# Patient Record
Sex: Male | Born: 1960 | Race: Black or African American | Hispanic: No | Marital: Married | Smoking: Current every day smoker
Health system: Southern US, Community
[De-identification: ages and names within clinical notes are randomized; demographics above are authoritative.]

## PROBLEM LIST (undated history)

## (undated) ENCOUNTER — Emergency Department (HOSPITAL_COMMUNITY): Discharge status: Absent without leave | Payer: Self-pay

## (undated) DIAGNOSIS — H409 Unspecified glaucoma: Secondary | ICD-10-CM

## (undated) DIAGNOSIS — I10 Essential (primary) hypertension: Secondary | ICD-10-CM

## (undated) HISTORY — PX: KNEE SURGERY: SHX244

## (undated) HISTORY — DX: Essential (primary) hypertension: I10

## (undated) HISTORY — DX: Unspecified glaucoma: H40.9

---

## 2013-08-20 ENCOUNTER — Emergency Department (HOSPITAL_COMMUNITY)
Admission: EM | Admit: 2013-08-20 | Discharge: 2013-08-20 | Disposition: A | Discharge status: Home / Self Care | Payer: Self-pay | Attending: Emergency Medicine | Admitting: Emergency Medicine

## 2013-08-20 DIAGNOSIS — R748 Abnormal levels of other serum enzymes: Secondary | ICD-10-CM | POA: Insufficient documentation

## 2013-08-20 DIAGNOSIS — K2970 Gastritis, unspecified, without bleeding: Secondary | ICD-10-CM | POA: Insufficient documentation

## 2013-08-20 DIAGNOSIS — R1013 Epigastric pain: Secondary | ICD-10-CM

## 2013-08-20 DIAGNOSIS — F1010 Alcohol abuse, uncomplicated: Secondary | ICD-10-CM | POA: Insufficient documentation

## 2013-08-20 DIAGNOSIS — Z720 Tobacco use: Secondary | ICD-10-CM

## 2013-08-20 DIAGNOSIS — K920 Hematemesis: Secondary | ICD-10-CM | POA: Insufficient documentation

## 2013-08-20 DIAGNOSIS — F17200 Nicotine dependence, unspecified, uncomplicated: Secondary | ICD-10-CM | POA: Insufficient documentation

## 2013-08-20 LAB — COMPREHENSIVE METABOLIC PANEL
Albumin: 3.5 g/dL (ref 3.5–5.2)
BUN: 12 mg/dL (ref 6–23)
CO2: 25 mEq/L (ref 19–32)
Calcium: 9.2 mg/dL (ref 8.4–10.5)
Creatinine, Ser: 0.79 mg/dL (ref 0.50–1.35)
GFR calc Af Amer: >90 mL/min (ref >90)
GFR calc non Af Amer: >90 mL/min (ref >90)
Glucose, Bld: 96 mg/dL (ref 70–99)

## 2013-08-20 LAB — CBC WITH DIFFERENTIAL/PLATELET
Basophils Absolute: 0 10*3/uL (ref 0.0–0.1)
Eosinophils Absolute: 0.4 10*3/uL (ref 0.0–0.7)
Eosinophils Relative: 6 % — ABNORMAL HIGH (ref 0–5)
HCT: 42.2 % (ref 39.0–52.0)
Lymphocytes Relative: 46 % (ref 12–46)
Lymphs Abs: 3.1 10*3/uL (ref 0.7–4.0)
MCV: 80.8 fL (ref 78.0–100.0)
Monocytes Absolute: 0.6 10*3/uL (ref 0.1–1.0)
Monocytes Relative: 9 % (ref 3–12)
RBC: 5.22 MIL/uL (ref 4.22–5.81)
RDW: 13.1 % (ref 11.5–15.5)
WBC: 6.7 10*3/uL (ref 4.0–10.5)

## 2013-08-20 LAB — LIPASE, BLOOD: Lipase: 264 U/L — ABNORMAL HIGH (ref 11–59)

## 2013-08-20 LAB — URINALYSIS, ROUTINE W REFLEX MICROSCOPIC
Hgb urine dipstick: NEGATIVE
Ketones, ur: NEGATIVE mg/dL
Leukocytes, UA: NEGATIVE
Nitrite: NEGATIVE
Protein, ur: NEGATIVE mg/dL
Specific Gravity, Urine: 1.015 (ref 1.005–1.030)
Urobilinogen, UA: 0.2 mg/dL (ref 0.0–1.0)

## 2013-08-20 MED ORDER — MUPIROCIN CALCIUM 2 % NA OINT: TOPICAL_OINTMENT | NASAL | Status: DC

## 2013-08-20 MED ORDER — PANTOPRAZOLE SODIUM 20 MG PO TBEC: 40.0000 mg | DELAYED_RELEASE_TABLET | Freq: Every day | ORAL | Status: DC

## 2013-08-20 MED ORDER — POTASSIUM CHLORIDE CRYS ER 20 MEQ PO TBCR
40.0000 meq | EXTENDED_RELEASE_TABLET | Freq: Once | ORAL | Status: AC
  Administered 2013-08-20: 40 meq via ORAL
  Filled 2013-08-20: qty 2

## 2013-08-20 MED ORDER — CHLORHEXIDINE GLUCONATE 4 % EX LIQD: 60.0000 mL | Freq: Every day | CUTANEOUS | Status: DC | PRN

## 2013-08-20 NOTE — ED Notes (Signed)
Pt states that he had severe abd pain on Wednesday and it slowly subsided until tonight. Pt states he woke up and vomited x2 and the vomit was clear and then turned a light red color. Pt states he did not eat or drink anything red.

## 2013-08-20 NOTE — ED Provider Notes (Signed)
CSN: [id number]     Arrival date & time 08/20/13  0419 History   First MD Initiated Contact with Patient 08/20/13 [phone]     Chief Complaint  Patient presents with  . Abdominal Pain   (Consider location/radiation/quality/duration/timing/severity/associated sxs/prior Treatment) HPI  This patient is a middle-aged man drinks 240 ounce beers per day and smokes a pack of cigarettes per day. He presents with complaints of postprandial epigastric pain which is more on for the last 5 days. She reports and her last 24 hours. Yesterday morning, the patient had a single episode of , nonbilious emesis and noticed some tiny streaks of blood. He had a similar episode about 12 hours later. This prompted him to seek emergency care.  The patient is pain free at this time. He says he has pain only after eating. He denies fever. He has not had melena, diarrhea or genitourinary symptoms.  No past medical history on file. No past surgical history on file. No family history on file. History  Substance Use Topics  . Smoking status: Not on file  . Smokeless tobacco: Not on file  . Alcohol Use: Not on file    Review of Systems 10 POINT ROS PERFORMED AND IS NEGATIVE WITH THE EXCEPTION OF SX NOTED ABOVE AND COMPLAINT OF RECURRENT SKIN INFECTIONS.   Allergies  Review of patient's allergies indicates no known allergies.  Home Medications  No current outpatient prescriptions on file. BP 177/97  Pulse 78  Temp(Src) 98.6 F (37 C) (Oral)  Resp 16  SpO2 93% Physical Exam Gen: well developed and well nourished appearing Head: NCAT Eyes: PERL, EOMI Nose: no epistaixis or rhinorrhea Mouth/throat: mucosa is moist and pink Neck: supple, no stridor Lungs: CTA B, no wheezing, rhonchi or rales CV: RRR, no murmur Abd: soft, notender, nondistended Back: no ttp, no cva ttp Skin: warm and dry, no lesions Neuro: CN ii-xii grossly intact, no focal deficits Psyche; normal affect,  calm and cooperative.  Ext: no  edema, no ttp.  ED Course  Procedures (including critical care time) Labs Review  Results for orders placed during the hospital encounter of 08/20/13 (from the past 24 hour(s))  CBC WITH DIFFERENTIAL     Status: Abnormal   Collection Time    08/20/13  4:20 AM      Result Value Range   WBC 6.7  4.0 - 10.5 K/uL   RBC 5.22  4.22 - 5.81 MIL/uL   Hemoglobin 14.8  13.0 - 17.0 g/dL   HCT 11.9  14.7 - 82.9 %   MCV 80.8  78.0 - 100.0 fL   MCH 28.4  26.0 - 34.0 pg   MCHC 35.1  30.0 - 36.0 g/dL   RDW 56.2  13.0 - 86.5 %   Platelets 212  150 - 400 K/uL   Neutrophils Relative % 39 (*) 43 - 77 %   Neutro Abs 2.6  1.7 - 7.7 K/uL   Lymphocytes Relative 46  12 - 46 %   Lymphs Abs 3.1  0.7 - 4.0 K/uL   Monocytes Relative 9  3 - 12 %   Monocytes Absolute 0.6  0.1 - 1.0 K/uL   Eosinophils Relative 6 (*) 0 - 5 %   Eosinophils Absolute 0.4  0.0 - 0.7 K/uL   Basophils Relative 0  0 - 1 %   Basophils Absolute 0.0  0.0 - 0.1 K/uL  COMPREHENSIVE METABOLIC PANEL     Status: Abnormal   Collection Time    08/20/13  4:20 AM      Result Value Range   Sodium 138  135 - 145 mEq/L   Potassium 3.1 (*) 3.5 - 5.1 mEq/L   Chloride 100  96 - 112 mEq/L   CO2 25  19 - 32 mEq/L   Glucose, Bld 96  70 - 99 mg/dL   BUN 12  6 - 23 mg/dL   Creatinine, Ser 1.61  0.50 - 1.35 mg/dL   Calcium 9.2  8.4 - 09.6 mg/dL   Total Protein 8.3  6.0 - 8.3 g/dL   Albumin 3.5  3.5 - 5.2 g/dL   AST 25  0 - 37 U/L   ALT 24  0 - 53 U/L   Alkaline Phosphatase 93  39 - 117 U/L   Total Bilirubin 0.3  0.3 - 1.2 mg/dL   GFR calc non Af Amer >90  >90 mL/min   GFR calc Af Amer >90  >90 mL/min  LIPASE, BLOOD     Status: Abnormal   Collection Time    08/20/13  4:20 AM      Result Value Range   Lipase 264 (*) 11 - 59 U/L  URINALYSIS, ROUTINE W REFLEX MICROSCOPIC     Status: None   Collection Time    08/20/13  5:27 AM      Result Value Range   Color, Urine YELLOW  YELLOW   APPearance CLEAR  CLEAR   Specific Gravity, Urine 1.015   1.005 - 1.030   pH 7.5  5.0 - 8.0   Glucose, UA NEGATIVE  NEGATIVE mg/dL   Hgb urine dipstick NEGATIVE  NEGATIVE   Bilirubin Urine NEGATIVE  NEGATIVE   Ketones, ur NEGATIVE  NEGATIVE mg/dL   Protein, ur NEGATIVE  NEGATIVE mg/dL   Urobilinogen, UA 0.2  0.0 - 1.0 mg/dL   Nitrite NEGATIVE  NEGATIVE   Leukocytes, UA NEGATIVE  NEGATIVE     MDM   DDX: gastritis, PUD, GERD, pancreatitis, gallbladder disease, SBO, colitis, UTI, enteritis.   0700:  Patient with benign abdominal exam and no abdominal pain. VS are wnl. W/U is notable for mildly elevated lipase and K of 3.1. We are supplementing potassium. The patient is able to tolerate po intake without difficulty. I have explained clinical dx of pancreatitis - likely related to alcohol consumption. In addition, I think the patient has some degree of alcoholic gastritis. I have recommended abstinence from alcohol, initiation of PPI and close outpatient f/u.   Wife brings up concerns for recurrent skin abscesses and we will prescribe mucipron nasal ointment and Hibiclens wash. Will refer to [facility].    Brandt Loosen, MD 08/20/13 [phone]

## 2013-08-20 NOTE — ED Notes (Signed)
Pt states emesis is bloody

## 2013-08-20 NOTE — ED Notes (Signed)
Pt c/o burning sensation epigastric region when he lays down. N/V x 2 in last 24 hours.

## 2014-04-27 ENCOUNTER — Ambulatory Visit: Payer: Self-pay

## 2014-05-18 ENCOUNTER — Ambulatory Visit: Payer: Self-pay

## 2015-01-06 ENCOUNTER — Encounter (HOSPITAL_COMMUNITY): Payer: Self-pay | Admitting: *Deleted

## 2015-01-06 ENCOUNTER — Emergency Department (HOSPITAL_COMMUNITY)
Admission: EM | Admit: 2015-01-06 | Discharge: 2015-01-06 | Disposition: A | Discharge status: Home / Self Care | Payer: Self-pay | Attending: Emergency Medicine | Admitting: Emergency Medicine

## 2015-01-06 ENCOUNTER — Emergency Department (HOSPITAL_COMMUNITY): Payer: Self-pay

## 2015-01-06 DIAGNOSIS — R519 Headache, unspecified: Secondary | ICD-10-CM

## 2015-01-06 DIAGNOSIS — Y9389 Activity, other specified: Secondary | ICD-10-CM | POA: Insufficient documentation

## 2015-01-06 DIAGNOSIS — R51 Headache: Secondary | ICD-10-CM

## 2015-01-06 DIAGNOSIS — Y998 Other external cause status: Secondary | ICD-10-CM | POA: Insufficient documentation

## 2015-01-06 DIAGNOSIS — Z720 Tobacco use: Secondary | ICD-10-CM | POA: Insufficient documentation

## 2015-01-06 DIAGNOSIS — Y9289 Other specified places as the place of occurrence of the external cause: Secondary | ICD-10-CM | POA: Insufficient documentation

## 2015-01-06 DIAGNOSIS — S0993XA Unspecified injury of face, initial encounter: Secondary | ICD-10-CM | POA: Insufficient documentation
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
## 2015-01-06 NOTE — ED Notes (Signed)
Awake. Verbally responsive. A/O x4. Resp even and unlabored. No audible adventitious breath sounds noted. ABC's intact.  
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
## 2015-09-17 ENCOUNTER — Encounter (HOSPITAL_COMMUNITY): Payer: Self-pay

## 2015-09-17 ENCOUNTER — Emergency Department (HOSPITAL_COMMUNITY)
Admission: EM | Admit: 2015-09-17 | Discharge: 2015-09-17 | Disposition: A | Discharge status: Home / Self Care | Payer: Self-pay | Attending: Emergency Medicine | Admitting: Emergency Medicine

## 2015-09-17 ENCOUNTER — Emergency Department (HOSPITAL_COMMUNITY): Payer: Self-pay

## 2015-09-17 DIAGNOSIS — R1110 Vomiting, unspecified: Secondary | ICD-10-CM | POA: Insufficient documentation

## 2015-09-17 DIAGNOSIS — Z720 Tobacco use: Secondary | ICD-10-CM | POA: Insufficient documentation

## 2015-09-17 DIAGNOSIS — K5900 Constipation, unspecified: Secondary | ICD-10-CM | POA: Insufficient documentation

## 2015-09-17 MED ORDER — DOCUSATE SODIUM 100 MG PO CAPS: 100.0000 mg | ORAL_CAPSULE | Freq: Two times a day (BID) | ORAL | Status: DC

## 2015-09-17 MED ORDER — POLYETHYLENE GLYCOL 3350 17 G PO PACK
17.0000 g | PACK | Freq: Every day | ORAL | Status: DC
Start: 2015-09-17 — End: 2016-03-26

## 2015-09-17 NOTE — ED Notes (Signed)
Pt reports constipation since last Friday. Reports using stool softener with no relief. Passing gas.

## 2015-09-17 NOTE — Discharge Instructions (Signed)

## 2015-09-17 NOTE — ED Notes (Signed)
Patient transported to CT 

## 2015-09-17 NOTE — ED Provider Notes (Signed)
CSN: [id number]     Arrival date & time 09/17/15  1823 History   First MD Initiated Contact with Patient 09/17/15 2008     Chief Complaint  Patient presents with  . Constipation     (Consider location/radiation/quality/duration/timing/severity/associated sxs/prior Treatment) HPI Comments: Patient states he has been constipated since Friday. Has tried miralax without results.  Denies abdominal pain.  Does have intermittent vomiting. Denies blood in emesis.  Admits to frequent and heavy EtOH use.  Patient is a 54 y.o. male presenting with constipation. The history is provided by the patient. No language interpreter was used.  Constipation Severity:  Moderate Timing:  Intermittent Progression:  Worsening Ineffective treatments:  Miralax Associated symptoms: flatus and vomiting   Associated symptoms: no hematochezia     History reviewed. No pertinent past medical history. History reviewed. No pertinent past surgical history. No family history on file. Social History  Substance Use Topics  . Smoking status: Current Every Day Smoker  . Smokeless tobacco: None  . Alcohol Use: Yes    Review of Systems  Gastrointestinal: Positive for vomiting, constipation and flatus. Negative for hematochezia.  All other systems reviewed and are negative.     Allergies  Review of patient's allergies indicates no known allergies.  Home Medications   Prior to Admission medications   Medication Sig Start Date End Date Taking? Authorizing Provider  ibuprofen (ADVIL,MOTRIN) 200 MG tablet Take 400 mg by mouth every 6 (six) hours as needed for moderate pain.   Yes Historical Provider, MD  chlorhexidine (HIBICLENS) 4 % external liquid Apply 60 mLs (4 application total) topically daily as needed. Patient not taking: Reported on 01/06/2015 08/20/13   Elyn Peers, MD  mupirocin nasal ointment (BACTROBAN) 2 % Apply in each nostril daily TWICE A DAY FOR SEVEN DAYS. Patient not taking: Reported on  01/06/2015 08/20/13   Elyn Peers, MD  pantoprazole (PROTONIX) 20 MG tablet Take 2 tablets (40 mg total) by mouth daily. Patient not taking: Reported on 01/06/2015 08/20/13   Elyn Peers, MD   BP 162/94 mmHg  Pulse 82  Temp(Src) 98 F (36.7 C) (Oral)  Resp 18  SpO2 100% Physical Exam  Constitutional: He is oriented to person, place, and time. He appears well-developed and well-nourished. No distress.  HENT:  Head: Normocephalic.  Eyes: Pupils are equal, round, and reactive to light.  Neck: Neck supple.  Cardiovascular: Normal rate and regular rhythm.   Pulmonary/Chest: Effort normal and breath sounds normal.  Abdominal: Soft. There is no tenderness.  Musculoskeletal: He exhibits no edema or tenderness.  Neurological: He is alert and oriented to person, place, and time.  Skin: Skin is warm and dry.  Psychiatric: He has a normal mood and affect.  Nursing note and vitals reviewed.   ED Course  Procedures (including critical care time) Labs Review Labs Reviewed - No data to display  Imaging Review Dg Abd 2 Views  09/17/2015  CLINICAL DATA:  Constipation for 10 days. EXAM: ABDOMEN - 2 VIEW COMPARISON:  None. FINDINGS: The lung bases are clear. There is no free intra-abdominal air. No dilated bowel loops to suggest obstruction. Moderate volume of stool throughout the colon. No radiopaque calculi. No acute osseous abnormalities are seen. There is mild broad-based leftward curvature of the lumbar spine on upright imaging. IMPRESSION: Moderate stool burden.  No evidence of obstruction. Electronically Signed   By: Jeb Levering M.D.   On: 09/17/2015 21:35   I have personally reviewed and evaluated these images and lab results  as part of my medical decision-making.   EKG Interpretation None     Radiology results reviewed and shared with patient. No obstruction. Moderate  Stool burden. Symptomatic care instructions provided. Return precautions discussed. MDM   Final diagnoses:   Constipation        Etta Quill, NP 09/18/15 0040  Dorie Rank, MD 09/18/15 1041

## 2016-01-28 ENCOUNTER — Encounter: Payer: Self-pay | Admitting: Gastroenterology

## 2016-03-10 ENCOUNTER — Encounter: Payer: Self-pay | Admitting: Gastroenterology

## 2016-03-23 ENCOUNTER — Encounter: Payer: Self-pay | Admitting: Gastroenterology

## 2016-03-26 ENCOUNTER — Ambulatory Visit (AMBULATORY_SURGERY_CENTER): Payer: Self-pay | Admitting: *Deleted

## 2016-03-26 ENCOUNTER — Encounter: Payer: Self-pay | Admitting: Gastroenterology

## 2016-03-26 VITALS — Ht 78.0 in | Wt 229.0 lb

## 2016-03-26 DIAGNOSIS — Z1211 Encounter for screening for malignant neoplasm of colon: Secondary | ICD-10-CM

## 2016-03-26 MED ORDER — NA SULFATE-K SULFATE-MG SULF 17.5-3.13-1.6 GM/177ML PO SOLN: 1.0000 | Freq: Once | ORAL | Status: DC

## 2016-03-26 NOTE — Progress Notes (Signed)
No egg or soy allergy known to patient  No issues with past sedation with any surgeries  or procedures, no intubation problems  No diet pills per patient No home 02 use per patient  No blood thinners per patient  Pt denies issues with constipation  emmi video to e mail   [email or web address]

## 2016-03-31 ENCOUNTER — Encounter: Payer: Self-pay | Admitting: Gastroenterology

## 2016-03-31 ENCOUNTER — Ambulatory Visit (AMBULATORY_SURGERY_CENTER): Payer: BLUE CROSS/BLUE SHIELD | Admitting: Gastroenterology

## 2016-03-31 VITALS — BP 172/91 | HR 64 | Temp 99.1°F | Resp 22 | Ht 78.0 in | Wt 229.0 lb

## 2016-03-31 DIAGNOSIS — Z1211 Encounter for screening for malignant neoplasm of colon: Secondary | ICD-10-CM | POA: Diagnosis present

## 2016-03-31 DIAGNOSIS — D123 Benign neoplasm of transverse colon: Secondary | ICD-10-CM | POA: Diagnosis not present

## 2016-03-31 DIAGNOSIS — D120 Benign neoplasm of cecum: Secondary | ICD-10-CM

## 2016-03-31 DIAGNOSIS — D127 Benign neoplasm of rectosigmoid junction: Secondary | ICD-10-CM | POA: Diagnosis not present

## 2016-03-31 DIAGNOSIS — K621 Rectal polyp: Secondary | ICD-10-CM

## 2016-03-31 DIAGNOSIS — D128 Benign neoplasm of rectum: Secondary | ICD-10-CM

## 2016-03-31 DIAGNOSIS — D125 Benign neoplasm of sigmoid colon: Secondary | ICD-10-CM | POA: Diagnosis not present

## 2016-03-31 DIAGNOSIS — D129 Benign neoplasm of anus and anal canal: Secondary | ICD-10-CM

## 2016-03-31 MED ORDER — SODIUM CHLORIDE 0.9 % IV SOLN: 500.0000 mL | INTRAVENOUS | Status: DC

## 2016-03-31 NOTE — Progress Notes (Signed)
Patient awakening,vss,report to rn 

## 2016-03-31 NOTE — Patient Instructions (Signed)
Discharge instructions given. Handouts on polyps and hemorrhoids. Resume previous medications. No aspirin or anything containing aspirin for 2 weeks. YOU HAD AN ENDOSCOPIC PROCEDURE TODAY AT [facility]:   Refer to the procedure report that was given to you for any specific questions about what was found during the examination.  If the procedure report does not answer your questions, please call your gastroenterologist to clarify.  If you requested that your care partner not be given the details of your procedure findings, then the procedure report has been included in a sealed envelope for you to review at your convenience later.  YOU SHOULD EXPECT: Some feelings of bloating in the abdomen. Passage of more gas than usual.  Walking can help get rid of the air that was put into your GI tract during the procedure and reduce the bloating. If you had a lower endoscopy (such as a colonoscopy or flexible sigmoidoscopy) you may notice spotting of blood in your stool or on the toilet paper. If you underwent a bowel prep for your procedure, you may not have a normal bowel movement for a few days.  Please Note:  You might notice some irritation and congestion in your nose or some drainage.  This is from the oxygen used during your procedure.  There is no need for concern and it should clear up in a day or so.  SYMPTOMS TO REPORT IMMEDIATELY:   Following lower endoscopy (colonoscopy or flexible sigmoidoscopy):  Excessive amounts of blood in the stool  Significant tenderness or worsening of abdominal pains  Swelling of the abdomen that is new, acute  Fever of 100F or higher   For urgent or emergent issues, a gastroenterologist can be reached at any hour by calling [phone].   DIET: Your first meal following the procedure should be a small meal and then it is ok to progress to your normal diet. Heavy or fried foods are harder to digest and may make you feel nauseous or bloated.   Likewise, meals heavy in dairy and vegetables can increase bloating.  Drink plenty of fluids but you should avoid alcoholic beverages for 24 hours.  ACTIVITY:  You should plan to take it easy for the rest of today and you should NOT DRIVE or use heavy machinery until tomorrow (because of the sedation medicines used during the test).    FOLLOW UP: Our staff will call the number listed on your records the next business day following your procedure to check on you and address any questions or concerns that you may have regarding the information given to you following your procedure. If we do not reach you, we will leave a message.  However, if you are feeling well and you are not experiencing any problems, there is no need to return our call.  We will assume that you have returned to your regular daily activities without incident.  If any biopsies were taken you will be contacted by phone or by letter within the next 1-3 weeks.  Please call us at [phone] if you have not heard about the biopsies in 3 weeks.    SIGNATURES/CONFIDENTIALITY: You and/or your care partner have signed paperwork which will be entered into your electronic medical record.  These signatures attest to the fact that that the information above on your After Visit Summary has been reviewed and is understood.  Full responsibility of the confidentiality of this discharge information lies with you and/or your care-partner.

## 2016-03-31 NOTE — Op Note (Signed)
[place] Patient Name: Steven Dougherty Procedure Date: 03/31/2016 7:37 AM MRN: [id number] Endoscopist: Remo Lipps P. Havery Moros , MD Age: 54 Referring MD:  Date of Birth: November 09, 1961 Gender: Male Procedure:                Colonoscopy Indications:              Screening for malignant neoplasm in the colon, This                            is the patient's first colonoscopy Medicines:                Monitored Anesthesia Care Procedure:                Pre-Anesthesia Assessment:                           - Prior to the procedure, a History and Physical                            was performed, and patient medications and                            allergies were reviewed. The patient's tolerance of                            previous anesthesia was also reviewed. The risks                            and benefits of the procedure and the sedation                            options and risks were discussed with the patient.                            All questions were answered, and informed consent                            was obtained. Prior Anticoagulants: The patient has                            taken no previous anticoagulant or antiplatelet                            agents. ASA Grade Assessment: II - A patient with                            mild systemic disease. After reviewing the risks                            and benefits, the patient was deemed in                            satisfactory condition to undergo the procedure.  After obtaining informed consent, the colonoscope                            was passed under direct vision. Throughout the                            procedure, the patient's blood pressure, pulse, and                            oxygen saturations were monitored continuously. The                            Model CF-HQ190L [id number]) scope was introduced                            through the anus and advanced to the the  cecum,                            identified by appendiceal orifice and ileocecal                            valve. The colonoscopy was performed without                            difficulty. The patient tolerated the procedure                            well. The quality of the bowel preparation was                            adequate. The ileocecal valve, appendiceal orifice,                            and rectum were photographed. Scope In: 8:00:53 AM Scope Out: 8:19:27 AM Scope Withdrawal Time: 0 hours 16 minutes 34 seconds  Total Procedure Duration: 0 hours 18 minutes 34 seconds  Findings:                 The perianal and digital rectal examinations were                            normal.                           A 4 mm polyp was found in the cecum. The polyp was                            sessile. The polyp was removed with a cold snare.                            Resection and retrieval were complete.                           A 3 mm polyp was found in the transverse colon. The  polyp was sessile. The polyp was removed with a                            cold biopsy forceps. Resection and retrieval were                            complete.                           Two flat polyps were found in the sigmoid colon.                            The polyps were 4 to 5 mm in size. These polyps                            were removed with a cold snare. Resection and                            retrieval were complete.                           Multiple flat polyps were noted in the rectum and                            rectosigmoid colon, consistent with hyperplastic                            polyps. A representative 4 mm polyp was found in                            the rectum. The polyp was flat. The polyp was                            removed with a cold biopsy forceps. Resection and                            retrieval were complete.                            Non-bleeding internal hemorrhoids were found during                            retroflexion.                           The exam was otherwise without abnormality. Complications:            No immediate complications. Estimated blood loss:                            Minimal. Estimated Blood Loss:     Estimated blood loss was minimal. Impression:               - One 4 mm polyp in the cecum, removed with a cold  snare. Resected and retrieved.                           - One 3 mm polyp in the transverse colon, removed                            with a cold biopsy forceps. Resected and retrieved.                           - Two 4 to 5 mm polyps in the sigmoid colon,                            removed with a cold snare. Resected and retrieved.                           - Multiple suspected rectal hyperplastic polyps,                            one representative polyp removed with a cold biopsy                            forceps. Resected and retrieved.                           - Non-bleeding internal hemorrhoids.                           - The examination was otherwise normal. Recommendation:           - Patient has a contact number available for                            emergencies. The signs and symptoms of potential                            delayed complications were discussed with the                            patient. Return to normal activities tomorrow.                            Written discharge instructions were provided to the                            patient.                           - Resume previous diet.                           - Continue present medications.                           - No aspirin, ibuprofen, naproxen, or other  non-steroidal anti-inflammatory drugs for 2 weeks                            after polyp removal.                           - Await pathology results.                           - Repeat  colonoscopy is recommended for                            surveillance. The colonoscopy date will be                            determined after pathology results from today's                            exam become available for review. Remo Lipps P. Havery Moros, MD 03/31/2016 8:24:41 AM This report has been signed electronically.

## 2016-03-31 NOTE — Progress Notes (Signed)
Called to room to assist during endoscopic procedure.  Patient ID and intended procedure confirmed with present staff. Received instructions for my participation in the procedure from the performing physician.  

## 2016-04-01 ENCOUNTER — Telehealth: Payer: Self-pay | Admitting: *Deleted

## 2016-04-01 NOTE — Telephone Encounter (Signed)
  Follow up Call-  Call back number 03/31/2016  Post procedure Call Back phone  # [phone]   Permission to leave phone message Yes     No answer, left message.

## 2016-04-03 ENCOUNTER — Encounter: Payer: Self-pay | Admitting: Gastroenterology

## 2016-08-19 ENCOUNTER — Emergency Department (HOSPITAL_COMMUNITY)
Admission: EM | Admit: 2016-08-19 | Discharge: 2016-08-20 | Disposition: A | Discharge status: Home / Self Care | Payer: BLUE CROSS/BLUE SHIELD | Attending: Emergency Medicine | Admitting: Emergency Medicine

## 2016-08-19 ENCOUNTER — Emergency Department (HOSPITAL_COMMUNITY): Payer: BLUE CROSS/BLUE SHIELD

## 2016-08-19 ENCOUNTER — Encounter (HOSPITAL_COMMUNITY): Payer: Self-pay

## 2016-08-19 DIAGNOSIS — M25551 Pain in right hip: Secondary | ICD-10-CM | POA: Diagnosis not present

## 2016-08-19 DIAGNOSIS — M1711 Unilateral primary osteoarthritis, right knee: Secondary | ICD-10-CM | POA: Insufficient documentation

## 2016-08-19 DIAGNOSIS — Z79899 Other long term (current) drug therapy: Secondary | ICD-10-CM | POA: Insufficient documentation

## 2016-08-19 DIAGNOSIS — I10 Essential (primary) hypertension: Secondary | ICD-10-CM | POA: Insufficient documentation

## 2016-08-19 DIAGNOSIS — F17210 Nicotine dependence, cigarettes, uncomplicated: Secondary | ICD-10-CM | POA: Diagnosis not present

## 2016-08-19 DIAGNOSIS — M25561 Pain in right knee: Secondary | ICD-10-CM | POA: Diagnosis present

## 2016-08-19 NOTE — ED Provider Notes (Signed)
[facility] DEPT Provider Note   CSN: [id number] Arrival date & time: 08/19/16  2159     History   Chief Complaint Chief Complaint  Patient presents with  . Hip Pain    RIGHT    HPI Steven Dougherty is a 55 y.o. male.  HPI Steven Dougherty is a 55 y.o. male presents to ED with complaint of right leg pain. Pt states he developed right knee and right thigh pain about 2 wks ago. States since then pain progressively worse and now shoots down right lower leg and up into right hip and right lower back. Reports some tingling sensation in that area as well. Denies numbness or weakness in extremities. No injuries. No trouble controlling bladder or bowels. 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 Also complaining of some pain and soreness to bilateral shoulders. Denies any fever, chills, no swelling of any joints. Taking tylenol which is not helping.   Past Medical History:  Diagnosis Date  . Glaucoma    hx of with surgery to correct  . Hypertension     There are no active problems to display for this patient.   Past Surgical History:  Procedure Laterality Date  . KNEE SURGERY Right    07-08       Home Medications    Prior to Admission medications   Medication Sig Start Date End Date Taking? Authorizing Provider  amlodipine-benazepril (LOTREL) 2.5-10 MG capsule Take 1 capsule by mouth daily. Reported on 03/26/2016 01/14/16 01/13/17  Historical Provider, MD    Family History Family History  Problem Relation Age of Onset  . Colon cancer Neg Hx   . Colon polyps Neg Hx   . Rectal cancer Neg Hx   . Stomach cancer Neg Hx     Social History Social History  Substance Use Topics  . Smoking status: Current Every Day Smoker    Packs/day: 0.50    Types: Cigarettes  . Smokeless tobacco: Never Used  . Alcohol use 0.0 oz/week     Comment: beer 2 - 40 ounces a day      Allergies   Review of patient's  allergies indicates no known allergies.   Review of Systems Review of Systems  Constitutional: Negative for chills and fever.  Genitourinary: Negative for dysuria.  Musculoskeletal: Positive for arthralgias, back pain and myalgias. Negative for neck pain.  Skin: Negative for rash.  Neurological: Negative for weakness and numbness.  All other systems reviewed and are negative.    Physical Exam Updated Vital Signs BP 150/93 (BP Location: Right Arm)   Pulse 73   Temp 98.2 F (36.8 C) (Oral)   Resp 18   Ht 6\' 6"  (1.981 m)   Wt 108.9 kg   SpO2 95%   BMI 27.73 kg/m   Physical Exam  Constitutional: He appears well-developed and well-nourished. No distress.  HENT:  Head: Normocephalic and atraumatic.  Eyes: Conjunctivae are normal.  Neck: Neck supple.  Cardiovascular: Normal rate, regular rhythm and normal heart sounds.   Pulmonary/Chest: Effort normal. No respiratory distress. He has no wheezes. He has no rales.  Musculoskeletal: He exhibits no edema.  ttp to the right SI joint and right buttock. Pain with right straight leg raise. No midline lumbar spine tenderness. No swelling or erythema to the right hip, right knee, right ankle joints. Full rom of all joints.   Neurological: He is alert.  5/5  and equal lower extremity strength. 2+ and equal patellar reflexes bilaterally. Pt able to dorsiflex bilateral toes and feet with good strength against resistance. Equal sensation bilaterally over thighs and lower legs.   Skin: Skin is warm and dry.  Nursing note and vitals reviewed.    ED Treatments / Results  Labs (all labs ordered are listed, but only abnormal results are displayed) Labs Reviewed - No data to display  EKG  EKG Interpretation None       Radiology No results found.  Procedures Procedures (including critical care time)  Medications Ordered in ED Medications - No data to display   Initial Impression / Assessment and Plan / ED Course  I have  reviewed the triage vital signs and the nursing notes.  Pertinent labs & imaging results that were available during my care of the patient were reviewed by me and considered in my medical decision making (see chart for details).  Clinical Course    Patient with right leg pain, mainly complaining of pain in his knee, hip, and what sounds like most likely radicular pain from the right lower back down right leg. No numbness or weakness in the leg. No evidence of cauda equina. He is ambulatory. He is moving all his joints. No fever. X-rays concerning for arthritis. Will start an NSAID, will prescribe mobic. Will have patient follow-up with orthopedics for further evaluation and treatment.  Vitals:   08/19/16 2223 08/19/16 2237 08/20/16 0023  BP: 150/93  151/87  Pulse: 73  78  Resp: 18  16  Temp: 98.2 F (36.8 C)  97.8 F (36.6 C)  TempSrc: Oral  Oral  SpO2: 95%  96%  Weight:  108.9 kg   Height:  6\' 6"  (1.981 m)      Final Clinical Impressions(s) / ED Diagnoses   Final diagnoses:  Osteoarthritis of right knee, unspecified osteoarthritis type  Right hip pain    New Prescriptions Discharge Medication List as of 08/20/2016 12:16 AM    START taking these medications   Details  meloxicam (MOBIC) 15 MG tablet Take 1 tablet (15 mg total) by mouth daily., Starting Thu 08/20/2016, Print         Jeannett Senior, PA-C 08/20/16 [place], DO 08/20/16 [id number]

## 2016-08-19 NOTE — ED Triage Notes (Addendum)
PT HAS MULTIPLE COMPLAINTS THAT CONSIST OF RIGHT KNEE PAIN THAT STARTED 2 WEEKS AGO. PT STS A WEEK LATER, THE PAIN WENT UP TO THE RIGHT HIP, THEN DOWN TO THE RIGHT FOOT. AND ALSO LEFT SHOULDER PAIN. 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

## 2016-08-20 MED ORDER — MELOXICAM 15 MG PO TABS: 15.0000 mg | ORAL_TABLET | Freq: Every day | ORAL | 0 refills | Status: AC

## 2016-08-20 NOTE — Discharge Instructions (Signed)
Take mobic as prescribed for pain and inflammation. Follow up with orthopedics for further evaluation.

## 2017-07-13 IMAGING — CR DG KNEE COMPLETE 4+V*R*
4 series · 4 of 4 positions shown · non-contrast
Comparison: None.

CLINICAL DATA: Right knee pain for 3 weeks

EXAM:
RIGHT KNEE - COMPLETE 4+ VIEW

[t knee ap right]
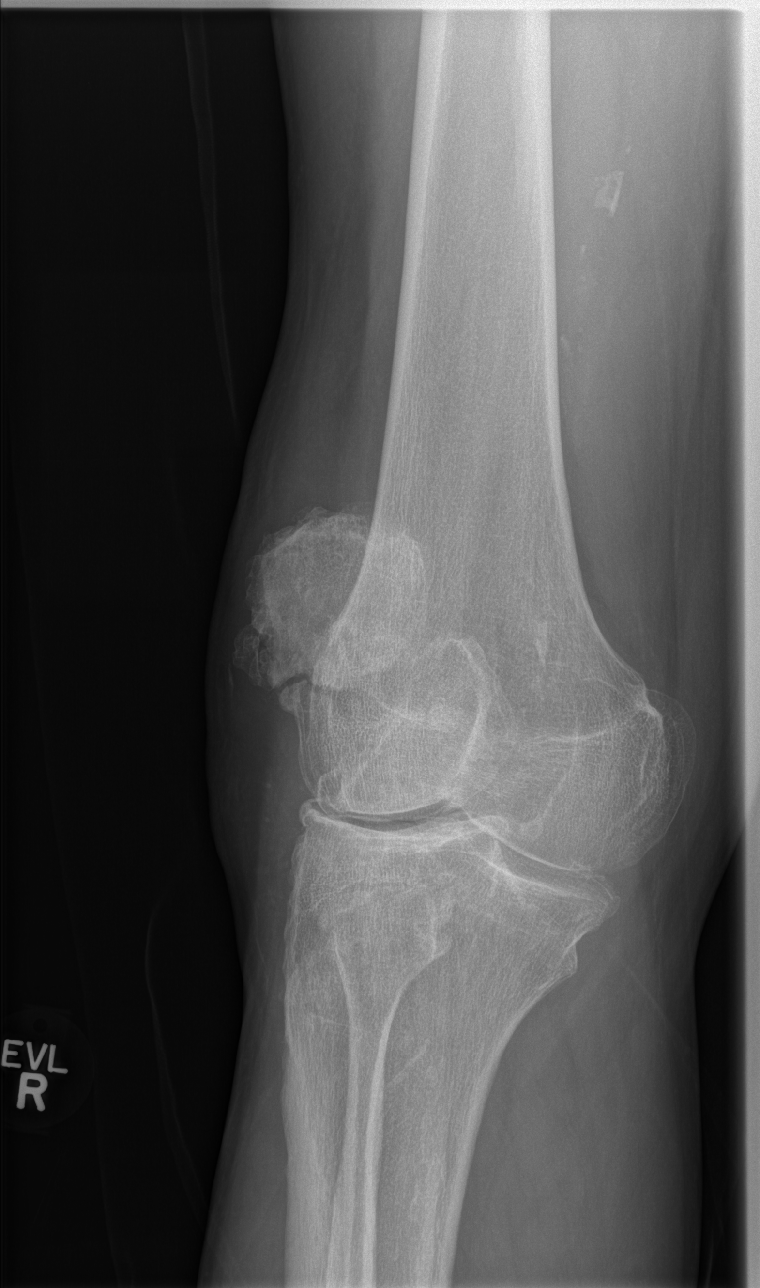

[t knee obl right (1 of 2)]
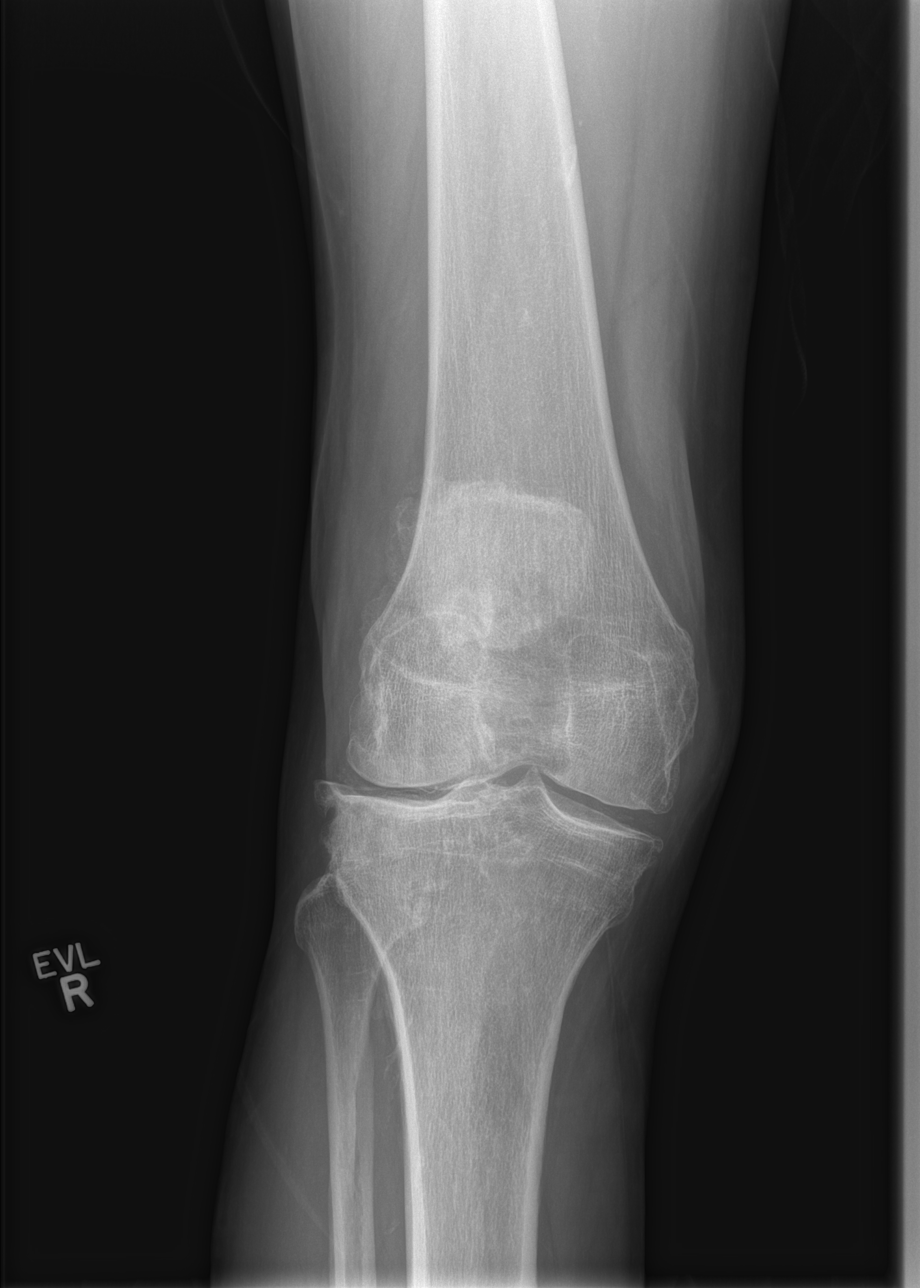

[t knee obl right (2 of 2)]
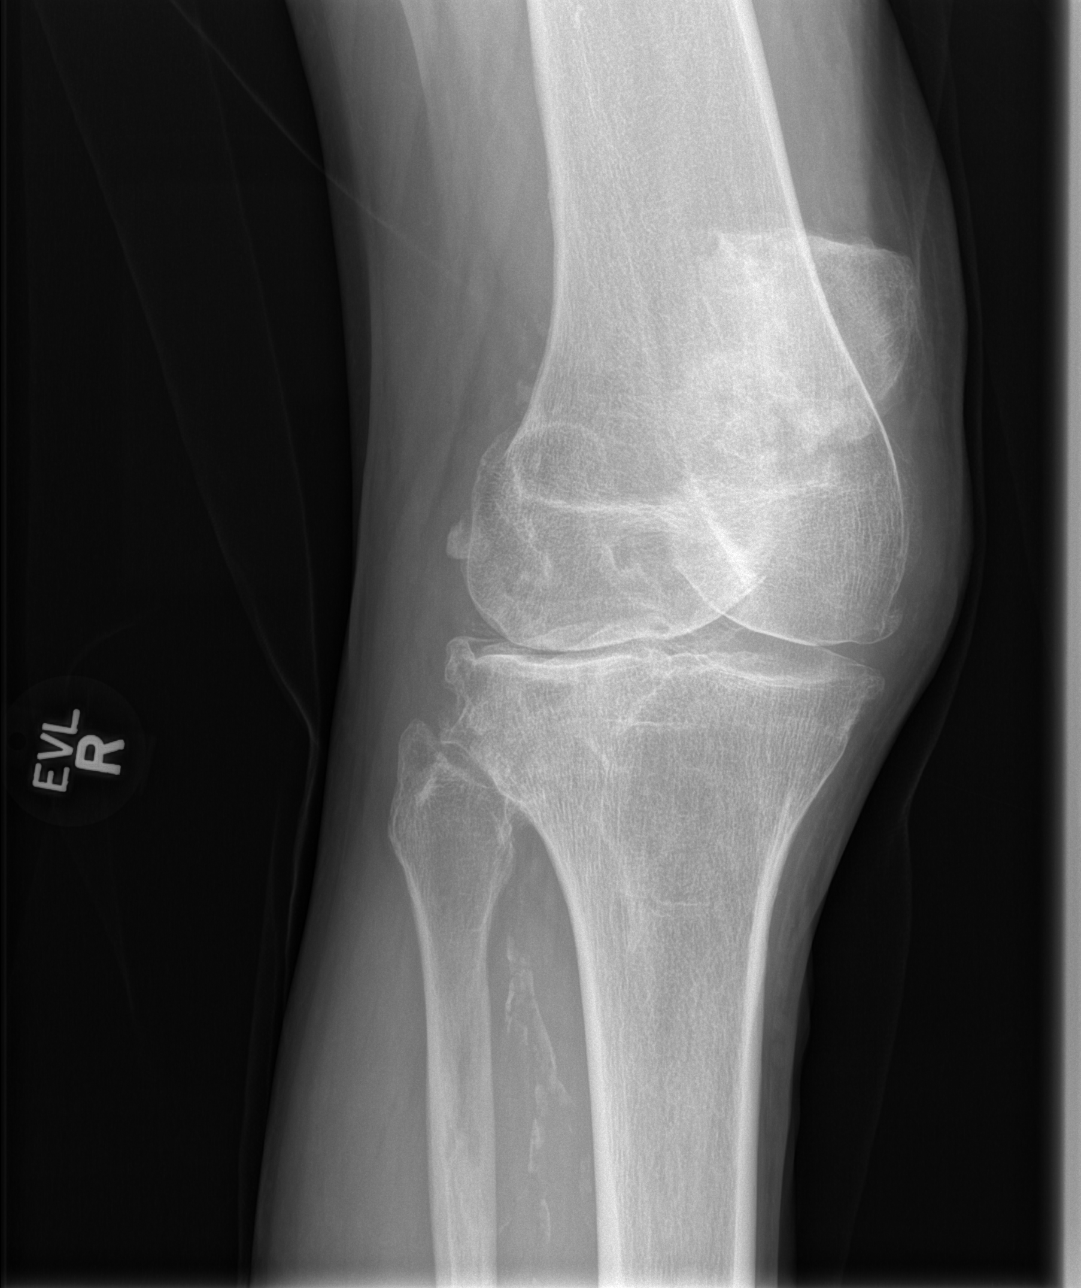

[t knee lat right]
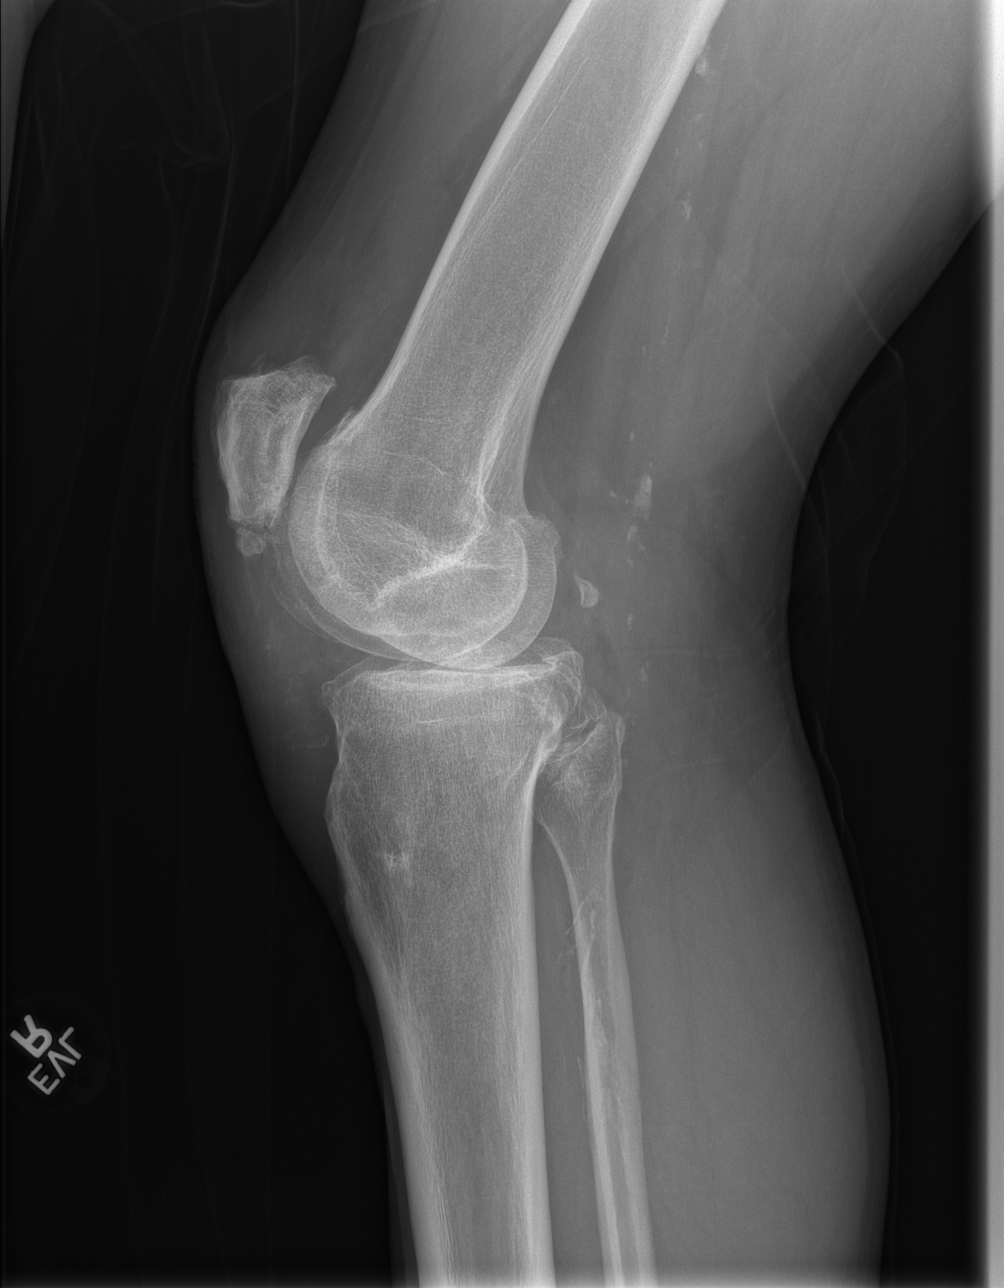

[4 of 4 positions shown; findings below may reference images not displayed]

FINDINGS: Small suprapatellar joint effusion. Moderate patellofemoral
degenerative changes with multiple superior and inferior bony spurs.
No acute fracture or dislocation. Moderate narrowing of the lateral
compartment with bony spurring. Mild narrowing of the medial
compartment with bony spurring. Joint space calcifications medially
and laterally, consistent with chondrocalcinosis. Dense popliteal
vascular calcifications.
IMPRESSION: 1. No acute osseous abnormality.
2. Moderate arthritic changes of the right knee.
3. Small effusion.
4. Vascular calcification
5. Chondrocalcinosis

## 2017-07-13 IMAGING — CR DG HIP (WITH OR WITHOUT PELVIS) 2-3V*R*
3 series · 3 of 3 positions shown · non-contrast
Comparison: None.

CLINICAL DATA: Pain

EXAM:
DG HIP (WITH OR WITHOUT PELVIS) 2-3V RIGHT

[t pelvis ap]
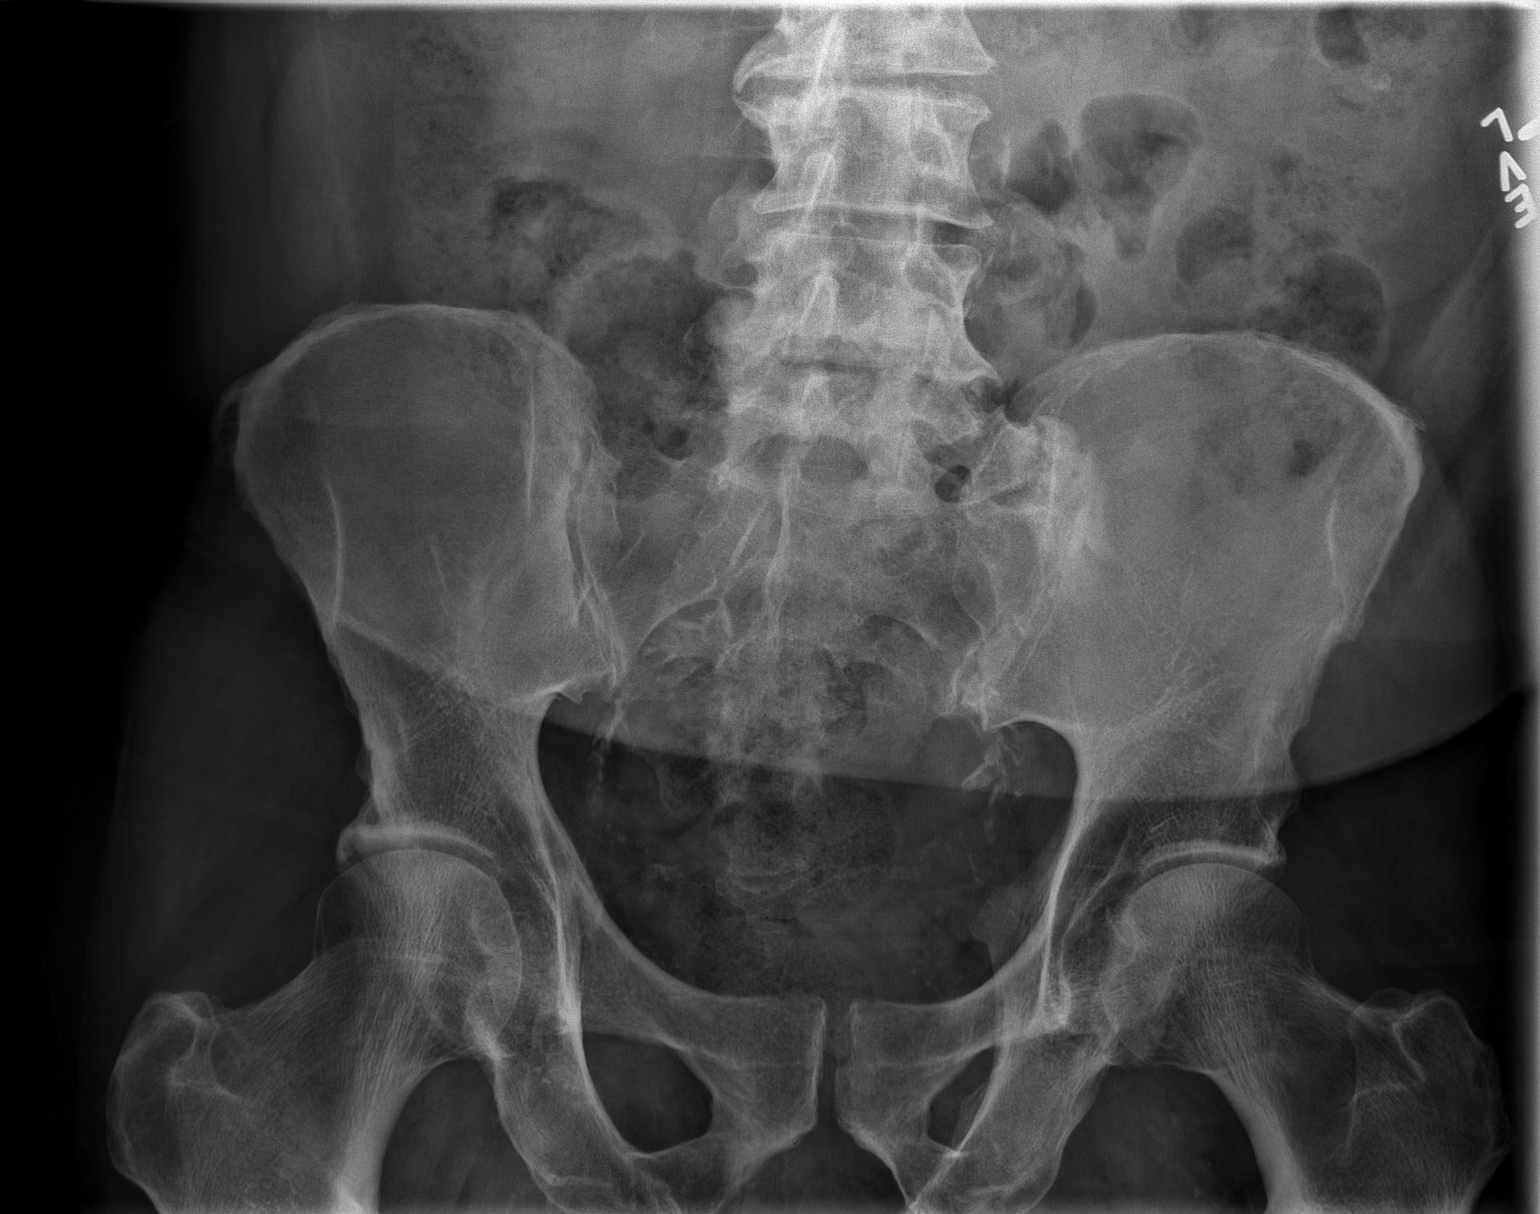

[t hip ap right]
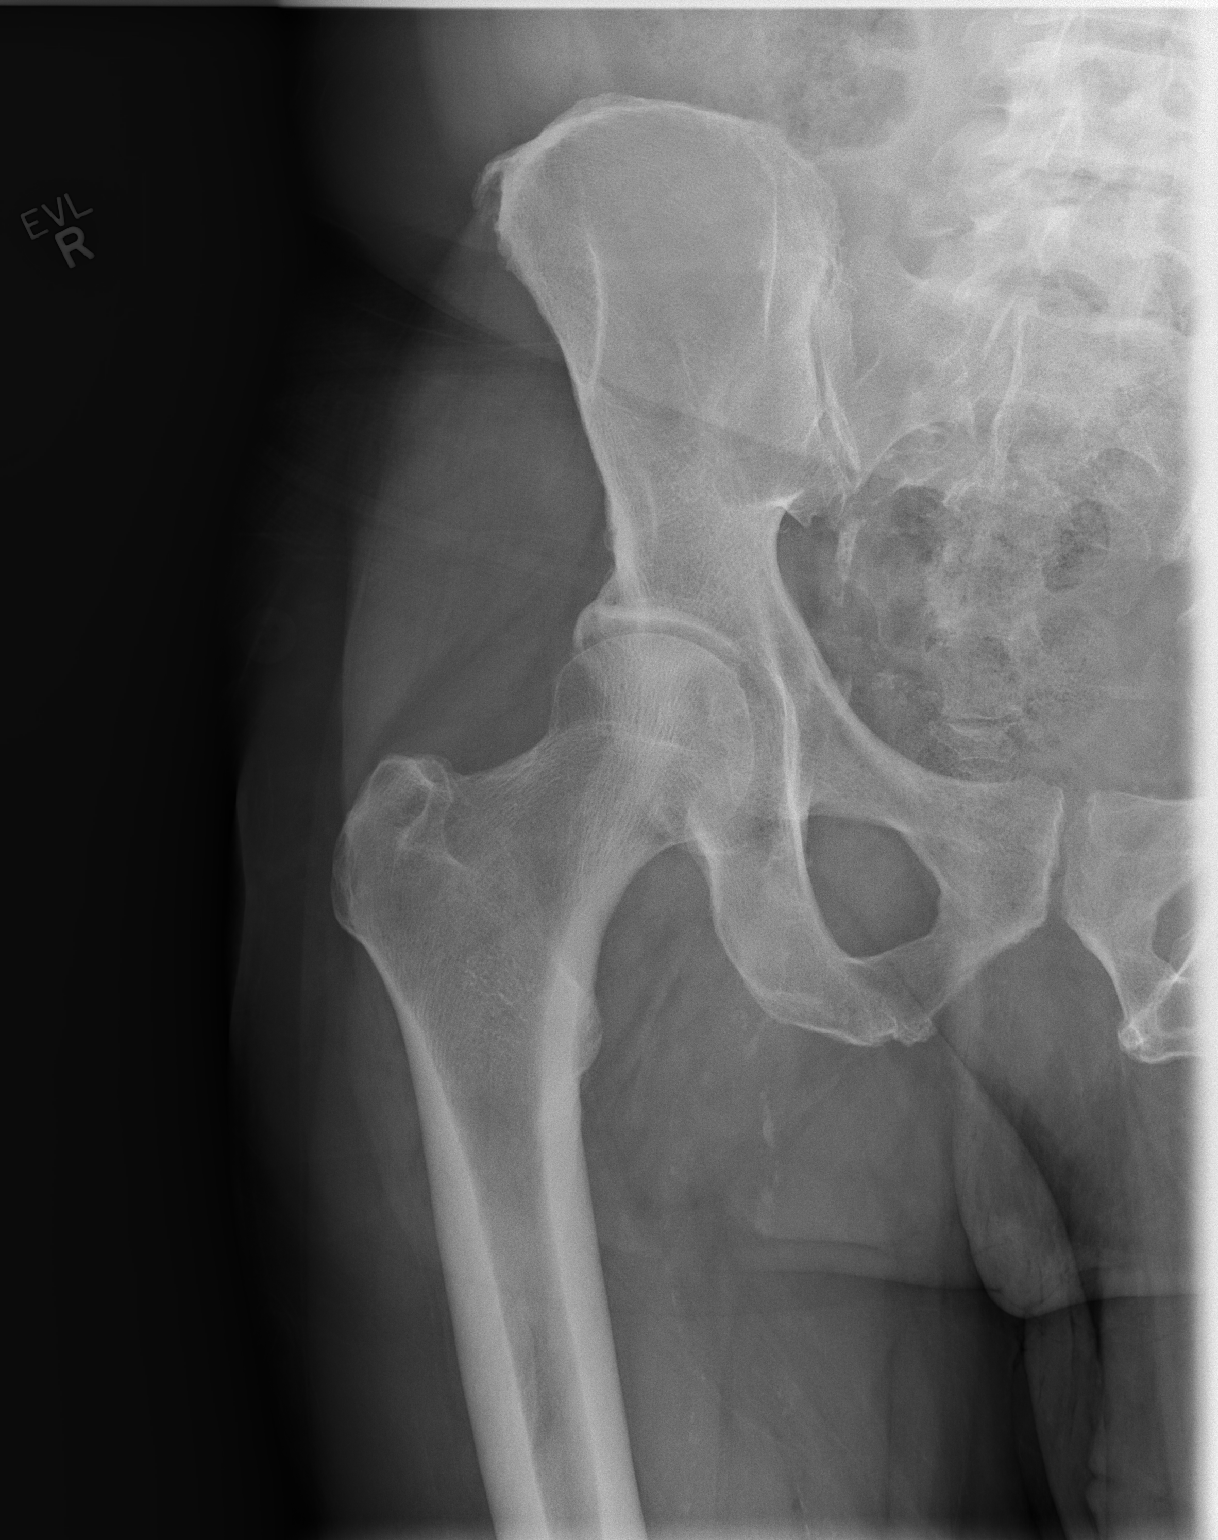

[t hip frog leg right]
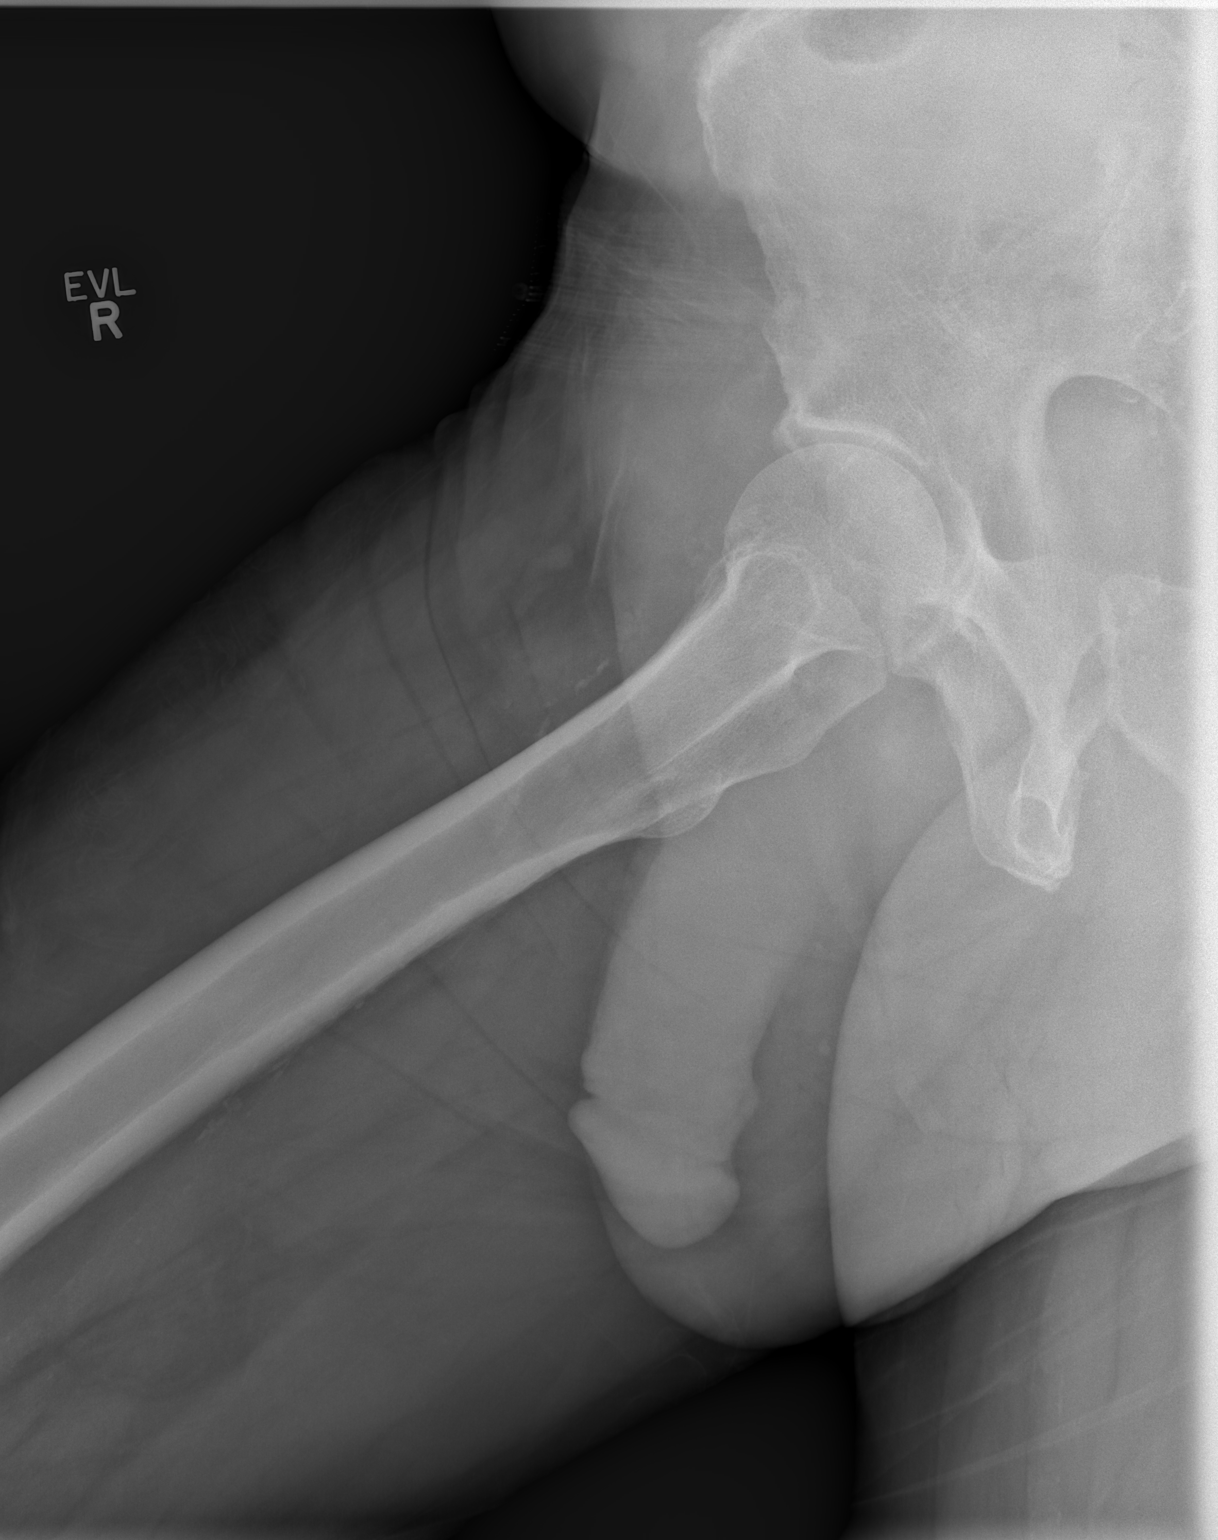

[3 of 3 positions shown; findings below may reference images not displayed]

FINDINGS: No acute fracture or malalignment. Pubic symphysis is intact. Left
greater than right SI joint disease. Dense vascular calcifications
within the pelvis and right thigh. Mild degenerative changes of the
bilateral hips.
IMPRESSION: 1. No acute osseous abnormality.
2. Vascular calcification.
3. Mild degenerative changes

## 2021-07-21 ENCOUNTER — Encounter: Payer: Self-pay | Admitting: Gastroenterology
